# Patient Record
Sex: Female | Born: 1970 | Hispanic: Yes | Marital: Married
Health system: Southern US, Community
[De-identification: ages and names within clinical notes are randomized; demographics above are authoritative.]

## PROBLEM LIST (undated history)

## (undated) DIAGNOSIS — E119 Type 2 diabetes mellitus without complications: Secondary | ICD-10-CM

---

## 2004-10-26 ENCOUNTER — Emergency Department: Payer: Self-pay | Admitting: General Practice

## 2015-11-27 ENCOUNTER — Emergency Department (HOSPITAL_COMMUNITY): Payer: Self-pay

## 2015-11-27 ENCOUNTER — Emergency Department (HOSPITAL_COMMUNITY)
Admission: EM | Admit: 2015-11-27 | Discharge: 2015-11-27 | Disposition: A | Discharge status: Home / Self Care | Payer: Self-pay | Attending: Emergency Medicine | Admitting: Emergency Medicine

## 2015-11-27 ENCOUNTER — Encounter (HOSPITAL_COMMUNITY): Payer: Self-pay | Admitting: *Deleted

## 2015-11-27 DIAGNOSIS — Y9389 Activity, other specified: Secondary | ICD-10-CM | POA: Insufficient documentation

## 2015-11-27 DIAGNOSIS — R739 Hyperglycemia, unspecified: Secondary | ICD-10-CM | POA: Insufficient documentation

## 2015-11-27 DIAGNOSIS — Y92410 Unspecified street and highway as the place of occurrence of the external cause: Secondary | ICD-10-CM | POA: Insufficient documentation

## 2015-11-27 DIAGNOSIS — S3991XA Unspecified injury of abdomen, initial encounter: Secondary | ICD-10-CM | POA: Insufficient documentation

## 2015-11-27 DIAGNOSIS — R1013 Epigastric pain: Secondary | ICD-10-CM

## 2015-11-27 DIAGNOSIS — S3992XA Unspecified injury of lower back, initial encounter: Secondary | ICD-10-CM | POA: Insufficient documentation

## 2015-11-27 DIAGNOSIS — Y998 Other external cause status: Secondary | ICD-10-CM | POA: Insufficient documentation

## 2015-11-27 HISTORY — DX: Type 2 diabetes mellitus without complications: E11.9

## 2015-11-27 LAB — CBG MONITORING, ED: Glucose-Capillary: 235 mg/dL — ABNORMAL HIGH (ref 65–99)

## 2015-11-27 LAB — COMPREHENSIVE METABOLIC PANEL
ALT: 36 U/L (ref 14–54)
ANION GAP: 7 (ref 5–15)
AST: 26 U/L (ref 15–41)
Albumin: 4.1 g/dL (ref 3.5–5.0)
Alkaline Phosphatase: 105 U/L (ref 38–126)
BILIRUBIN TOTAL: 0.2 mg/dL — AB (ref 0.3–1.2)
BUN: 13 mg/dL (ref 6–20)
CO2: 24 mmol/L (ref 22–32)
Calcium: 9 mg/dL (ref 8.9–10.3)
Chloride: 103 mmol/L (ref 101–111)
Creatinine, Ser: 0.75 mg/dL (ref 0.44–1.00)
GFR calc Af Amer: >60 mL/min (ref >60)
Glucose, Bld: 433 mg/dL — ABNORMAL HIGH (ref 65–99)
POTASSIUM: 3.9 mmol/L (ref 3.5–5.1)
Sodium: 134 mmol/L — ABNORMAL LOW (ref 135–145)
TOTAL PROTEIN: 7.6 g/dL (ref 6.5–8.1)

## 2015-11-27 LAB — CBC WITH DIFFERENTIAL/PLATELET
BASOS ABS: 0 10*3/uL (ref 0.0–0.1)
BASOS PCT: 0 %
Eosinophils Absolute: 0.3 10*3/uL (ref 0.0–0.7)
Eosinophils Relative: 4 %
HEMATOCRIT: 38.9 % (ref 36.0–46.0)
HEMOGLOBIN: 13.8 g/dL (ref 12.0–15.0)
Lymphocytes Relative: 26 %
Lymphs Abs: 1.8 10*3/uL (ref 0.7–4.0)
MCH: 28.6 pg (ref 26.0–34.0)
MCHC: 35.5 g/dL (ref 30.0–36.0)
MCV: 80.5 fL (ref 78.0–100.0)
MONOS PCT: 5 %
Monocytes Absolute: 0.3 10*3/uL (ref 0.1–1.0)
NEUTROS ABS: 4.6 10*3/uL (ref 1.7–7.7)
Neutrophils Relative %: 65 %
Platelets: 201 10*3/uL (ref 150–400)
RBC: 4.83 MIL/uL (ref 3.87–5.11)
RDW: 12.5 % (ref 11.5–15.5)
WBC: 7.1 10*3/uL (ref 4.0–10.5)

## 2015-11-27 MED ORDER — SODIUM CHLORIDE 0.9 % IV BOLUS (SEPSIS)
1000.0000 mL | Freq: Once | INTRAVENOUS | Status: AC
  Administered 2015-11-27: 1000 mL via INTRAVENOUS

## 2015-11-27 MED ORDER — CYCLOBENZAPRINE HCL 5 MG PO TABS: 5.0000 mg | ORAL_TABLET | Freq: Three times a day (TID) | ORAL | Status: AC | PRN

## 2015-11-27 MED ORDER — IOHEXOL 300 MG/ML  SOLN
100.0000 mL | Freq: Once | INTRAMUSCULAR | Status: AC | PRN
  Administered 2015-11-27: 100 mL via INTRAVENOUS

## 2015-11-27 NOTE — Progress Notes (Signed)
Patient listed as not having insurance or a pcp.  Patient speaks Spanish, husband at bedside translating.  Patient's [place].  Patient's husband reports [facility] [place].  [facility] and her husband with list of free low income clinics in [place].  Patient's husband reports they live [place] away from [facility].  [facility]. Patient and patient's husband thankful for resources. No further EDCM needs at this time.
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
